# Patient Record
Sex: Male | Born: 1955 | Race: White | Hispanic: No | Marital: Married | Smoking: Never smoker
Health system: Southern US, Community
[De-identification: ages and names within clinical notes are randomized; demographics above are authoritative.]

## PROBLEM LIST (undated history)

## (undated) DIAGNOSIS — E215 Disorder of parathyroid gland, unspecified: Secondary | ICD-10-CM

## (undated) DIAGNOSIS — I2510 Atherosclerotic heart disease of native coronary artery without angina pectoris: Secondary | ICD-10-CM

## (undated) DIAGNOSIS — E785 Hyperlipidemia, unspecified: Secondary | ICD-10-CM

## (undated) DIAGNOSIS — E039 Hypothyroidism, unspecified: Secondary | ICD-10-CM

## (undated) DIAGNOSIS — Z85850 Personal history of malignant neoplasm of thyroid: Secondary | ICD-10-CM

## (undated) HISTORY — DX: Atherosclerotic heart disease of native coronary artery without angina pectoris: I25.10

## (undated) HISTORY — DX: Disorder of parathyroid gland, unspecified: E21.5

## (undated) HISTORY — PX: THYROIDECTOMY: SHX17

## (undated) HISTORY — DX: Hypothyroidism, unspecified: E03.9

## (undated) HISTORY — DX: Personal history of malignant neoplasm of thyroid: Z85.850

## (undated) HISTORY — DX: Hyperlipidemia, unspecified: E78.5

---

## 2020-06-03 DIAGNOSIS — E215 Disorder of parathyroid gland, unspecified: Secondary | ICD-10-CM | POA: Diagnosis not present

## 2020-06-03 DIAGNOSIS — Z125 Encounter for screening for malignant neoplasm of prostate: Secondary | ICD-10-CM | POA: Diagnosis not present

## 2020-06-03 DIAGNOSIS — Z85850 Personal history of malignant neoplasm of thyroid: Secondary | ICD-10-CM | POA: Diagnosis not present

## 2020-06-03 DIAGNOSIS — E785 Hyperlipidemia, unspecified: Secondary | ICD-10-CM | POA: Diagnosis not present

## 2020-06-03 DIAGNOSIS — E038 Other specified hypothyroidism: Secondary | ICD-10-CM | POA: Diagnosis not present

## 2020-06-30 DIAGNOSIS — R079 Chest pain, unspecified: Secondary | ICD-10-CM | POA: Diagnosis not present

## 2020-07-25 DIAGNOSIS — R7309 Other abnormal glucose: Secondary | ICD-10-CM | POA: Diagnosis not present

## 2020-07-25 DIAGNOSIS — E038 Other specified hypothyroidism: Secondary | ICD-10-CM | POA: Diagnosis not present

## 2020-07-25 DIAGNOSIS — R221 Localized swelling, mass and lump, neck: Secondary | ICD-10-CM | POA: Diagnosis not present

## 2020-07-25 DIAGNOSIS — E785 Hyperlipidemia, unspecified: Secondary | ICD-10-CM | POA: Diagnosis not present

## 2020-07-25 DIAGNOSIS — Z85850 Personal history of malignant neoplasm of thyroid: Secondary | ICD-10-CM | POA: Diagnosis not present

## 2020-08-04 DIAGNOSIS — H353111 Nonexudative age-related macular degeneration, right eye, early dry stage: Secondary | ICD-10-CM | POA: Diagnosis not present

## 2020-08-04 DIAGNOSIS — H52223 Regular astigmatism, bilateral: Secondary | ICD-10-CM | POA: Diagnosis not present

## 2020-08-04 DIAGNOSIS — H25043 Posterior subcapsular polar age-related cataract, bilateral: Secondary | ICD-10-CM | POA: Diagnosis not present

## 2020-08-18 ENCOUNTER — Other Ambulatory Visit: Payer: Self-pay | Admitting: Family Medicine

## 2020-08-18 DIAGNOSIS — R221 Localized swelling, mass and lump, neck: Secondary | ICD-10-CM

## 2020-08-31 ENCOUNTER — Ambulatory Visit
Admission: RE | Admit: 2020-08-31 | Discharge: 2020-08-31 | Disposition: A | Discharge status: Home / Self Care | Payer: BC Managed Care – PPO | Source: Ambulatory Visit | Attending: Family Medicine | Admitting: Family Medicine

## 2020-08-31 DIAGNOSIS — R221 Localized swelling, mass and lump, neck: Secondary | ICD-10-CM

## 2020-09-02 ENCOUNTER — Ambulatory Visit
Admission: RE | Admit: 2020-09-02 | Discharge: 2020-09-02 | Disposition: A | Discharge status: Home / Self Care | Payer: BC Managed Care – PPO | Source: Ambulatory Visit | Attending: Family Medicine | Admitting: Family Medicine

## 2020-09-02 ENCOUNTER — Other Ambulatory Visit: Payer: Self-pay | Admitting: Family Medicine

## 2020-09-02 ENCOUNTER — Other Ambulatory Visit: Payer: Self-pay

## 2020-09-02 DIAGNOSIS — E890 Postprocedural hypothyroidism: Secondary | ICD-10-CM | POA: Diagnosis not present

## 2020-09-02 DIAGNOSIS — K118 Other diseases of salivary glands: Secondary | ICD-10-CM

## 2020-09-02 DIAGNOSIS — Z85850 Personal history of malignant neoplasm of thyroid: Secondary | ICD-10-CM | POA: Diagnosis not present

## 2020-09-02 DIAGNOSIS — J0110 Acute frontal sinusitis, unspecified: Secondary | ICD-10-CM | POA: Diagnosis not present

## 2020-09-02 MED ORDER — IOPAMIDOL (ISOVUE-300) INJECTION 61%
75.0000 mL | Freq: Once | INTRAVENOUS | Status: AC | PRN
  Administered 2020-09-02: 75 mL via INTRAVENOUS

## 2020-12-22 DIAGNOSIS — E038 Other specified hypothyroidism: Secondary | ICD-10-CM | POA: Diagnosis not present

## 2020-12-22 DIAGNOSIS — E785 Hyperlipidemia, unspecified: Secondary | ICD-10-CM | POA: Diagnosis not present

## 2020-12-22 DIAGNOSIS — Z125 Encounter for screening for malignant neoplasm of prostate: Secondary | ICD-10-CM | POA: Diagnosis not present

## 2020-12-22 DIAGNOSIS — Z0000 Encounter for general adult medical examination without abnormal findings: Secondary | ICD-10-CM | POA: Diagnosis not present

## 2020-12-27 DIAGNOSIS — Z0000 Encounter for general adult medical examination without abnormal findings: Secondary | ICD-10-CM | POA: Diagnosis not present

## 2021-01-10 DIAGNOSIS — J343 Hypertrophy of nasal turbinates: Secondary | ICD-10-CM | POA: Diagnosis not present

## 2021-01-10 DIAGNOSIS — K118 Other diseases of salivary glands: Secondary | ICD-10-CM | POA: Diagnosis not present

## 2021-02-14 DIAGNOSIS — Z08 Encounter for follow-up examination after completed treatment for malignant neoplasm: Secondary | ICD-10-CM | POA: Diagnosis not present

## 2021-02-14 DIAGNOSIS — L814 Other melanin hyperpigmentation: Secondary | ICD-10-CM | POA: Diagnosis not present

## 2021-02-14 DIAGNOSIS — D225 Melanocytic nevi of trunk: Secondary | ICD-10-CM | POA: Diagnosis not present

## 2021-02-14 DIAGNOSIS — L821 Other seborrheic keratosis: Secondary | ICD-10-CM | POA: Diagnosis not present

## 2021-02-14 DIAGNOSIS — D485 Neoplasm of uncertain behavior of skin: Secondary | ICD-10-CM | POA: Diagnosis not present

## 2021-06-30 DIAGNOSIS — E038 Other specified hypothyroidism: Secondary | ICD-10-CM | POA: Diagnosis not present

## 2021-06-30 DIAGNOSIS — R7309 Other abnormal glucose: Secondary | ICD-10-CM | POA: Diagnosis not present

## 2021-06-30 DIAGNOSIS — E785 Hyperlipidemia, unspecified: Secondary | ICD-10-CM | POA: Diagnosis not present

## 2021-06-30 DIAGNOSIS — Z5181 Encounter for therapeutic drug level monitoring: Secondary | ICD-10-CM | POA: Diagnosis not present

## 2021-06-30 DIAGNOSIS — Z85850 Personal history of malignant neoplasm of thyroid: Secondary | ICD-10-CM | POA: Diagnosis not present

## 2021-08-31 DIAGNOSIS — L814 Other melanin hyperpigmentation: Secondary | ICD-10-CM | POA: Diagnosis not present

## 2021-08-31 DIAGNOSIS — D2262 Melanocytic nevi of left upper limb, including shoulder: Secondary | ICD-10-CM | POA: Diagnosis not present

## 2021-08-31 DIAGNOSIS — L821 Other seborrheic keratosis: Secondary | ICD-10-CM | POA: Diagnosis not present

## 2021-08-31 DIAGNOSIS — D225 Melanocytic nevi of trunk: Secondary | ICD-10-CM | POA: Diagnosis not present

## 2021-09-28 DIAGNOSIS — H52223 Regular astigmatism, bilateral: Secondary | ICD-10-CM | POA: Diagnosis not present

## 2021-09-28 DIAGNOSIS — H40033 Anatomical narrow angle, bilateral: Secondary | ICD-10-CM | POA: Diagnosis not present

## 2021-12-28 DIAGNOSIS — Z0000 Encounter for general adult medical examination without abnormal findings: Secondary | ICD-10-CM | POA: Diagnosis not present

## 2021-12-28 DIAGNOSIS — R7309 Other abnormal glucose: Secondary | ICD-10-CM | POA: Diagnosis not present

## 2021-12-28 DIAGNOSIS — Z85850 Personal history of malignant neoplasm of thyroid: Secondary | ICD-10-CM | POA: Diagnosis not present

## 2021-12-28 DIAGNOSIS — Z125 Encounter for screening for malignant neoplasm of prostate: Secondary | ICD-10-CM | POA: Diagnosis not present

## 2021-12-28 DIAGNOSIS — E785 Hyperlipidemia, unspecified: Secondary | ICD-10-CM | POA: Diagnosis not present

## 2021-12-28 DIAGNOSIS — E215 Disorder of parathyroid gland, unspecified: Secondary | ICD-10-CM | POA: Diagnosis not present

## 2021-12-28 DIAGNOSIS — E038 Other specified hypothyroidism: Secondary | ICD-10-CM | POA: Diagnosis not present

## 2022-01-02 DIAGNOSIS — Z85850 Personal history of malignant neoplasm of thyroid: Secondary | ICD-10-CM | POA: Diagnosis not present

## 2022-01-02 DIAGNOSIS — E785 Hyperlipidemia, unspecified: Secondary | ICD-10-CM | POA: Diagnosis not present

## 2022-01-02 DIAGNOSIS — Z0000 Encounter for general adult medical examination without abnormal findings: Secondary | ICD-10-CM | POA: Diagnosis not present

## 2022-01-02 DIAGNOSIS — E038 Other specified hypothyroidism: Secondary | ICD-10-CM | POA: Diagnosis not present

## 2022-01-14 NOTE — Progress Notes (Unsigned)
Cardiology Office Note   Date:  01/15/2022   ID:  Philipp, Callegari [birth year], MRN [id number]  PCP:  Farris Has, MD    No chief complaint on file.  CAD  Wt Readings from Last 3 Encounters:  01/15/22 233 lb 3.2 oz (105.8 kg)       History of Present Illness: Brian Bonilla is a 66 y.o. male who is being seen today for the evaluation of CAD at the request of Farris Has, MD.   He has a h/o CAD and wanted a local cardiologist.   In around 2018, he had calcium scoring CT in [place].  Score was high.  He was referred to heart doctor. He lost 45 pound with a vegan diet.  His blood work improved. He had a stress test which was normal.   Moved to [place] in December 2021.  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  Business has improved since the pandemic. Designs exhibits for trade-shows.  Stores exhibits in [employer].   Exercising now regularly with  a son who is a Psychologist, educational.  He has recently lost 8 lbs. Does cardi and light weights, stretching, balance work.   Brother with TIA.   Denies : Chest pain. Dizziness. Leg edema. Nitroglycerin use. Orthopnea. Palpitations. Paroxysmal nocturnal dyspnea. Shortness of breath. Syncope.      Past Medical History:  Diagnosis Date   CAD (coronary artery disease)    History of thyroid cancer    Hyperlipidemia    Hypothyroidism    Parathyroid abnormality (HCC)     Past Surgical History:  Procedure Laterality Date   THYROIDECTOMY       Current Outpatient Medications  Medication Sig Dispense Refill   aspirin EC 81 MG tablet Take 81 mg by mouth daily. Swallow whole.     atorvastatin (LIPITOR) 40 MG tablet Take 40 mg by mouth daily.     calcitRIOL (ROCALTROL) 0.5 MCG capsule Take 0.5 mcg by mouth daily.     CALCIUM-MAGNESIUM PO Take by mouth.     Coenzyme Q10 (COQ10 PO) Take by mouth.     levothyroxine (SYNTHROID) 150 MCG tablet Take 150 mcg by mouth every morning.     LYSINE PO Take by mouth.      MAGNESIUM PO Take by mouth. (Patient not taking: Reported on 01/15/2022)     No current facility-administered medications for this visit.    Allergies:   Patient has no known allergies.    Social History:  The patient  reports that he has never smoked. He has never used smokeless tobacco. He reports current alcohol use. He reports that he does not use drugs.   Family History:  The patient's family history includes Aneurysm in his mother; Healthy in his brother, brother, daughter, son, son, son, and son.    ROS:  Please see the history of present illness.   Otherwise, review of systems are positive for intentional weight loss.   All other systems are reviewed and negative.    PHYSICAL EXAM: VS:  BP 110/70   Pulse 78   Ht 6\' 2"  (1.88 m)   Wt 233 lb 3.2 oz (105.8 kg)   SpO2 97%   BMI 29.94 kg/m  , BMI Body mass index is 29.94 kg/m. GEN: Well nourished, well developed, in no acute distress HEENT: normal Neck: no JVD, carotid bruits, or masses Cardiac: RRR; no murmurs, rubs, or gallops,no edema  Respiratory:  clear to auscultation bilaterally, normal  work of breathing GI: soft, nontender, nondistended, + BS MS: no deformity or atrophy Skin: warm and dry, no rash Neuro:  Strength and sensation are intact Psych: euthymic mood, full affect   EKG:   The ekg ordered today demonstrates normal sinus rhythm    Recent Labs: No results found for requested labs within last 365 days.   Lipid Panel No results found for: "CHOL", "TRIG", "HDL", "CHOLHDL", "VLDL", "LDLCALC", "LDLDIRECT"   Other studies Reviewed: Additional studies/ records that were reviewed today with results demonstrating: labs reviewed: Total cholesterol 146 HDL 49 LDL 80 triglycerides 92 in August 2023.   ASSESSMENT AND PLAN:  Coronary calcification: He thinks his calcium score was in the 1200 range several years ago.  Currently, he is doing well.  No angina on medical therapy. Continue healthy preventive  lifestyle.  He is taking a baby aspirin daily.  No bleeding problems.  He follows a healthy diet.  He exercises regularly.   Hyperlipidemia: Tolerating atorvastatin well for many years.   Family h/o aneurysm: Mother with brain aneurysm.  Was on coumadin at the time.    Current medicines are reviewed at length with the patient today.  The patient concerns regarding his medicines were addressed.  The following changes have been made:  No change  Labs/ tests ordered today include:   Orders Placed This Encounter  Procedures   EKG 12-Lead    Recommend 150 minutes/week of aerobic exercise Low fat, low carb, high fiber diet recommended  Disposition:   FU in 1 year   Signed, Larae Grooms, MD  01/15/2022 10:45 AM    [address], [place], [place]  [address] Phone: [phone]; Fax: [phone]

## 2022-01-15 ENCOUNTER — Encounter: Payer: Self-pay | Admitting: Interventional Cardiology

## 2022-01-15 ENCOUNTER — Ambulatory Visit: Payer: BC Managed Care – PPO | Attending: Interventional Cardiology | Admitting: Interventional Cardiology

## 2022-01-15 VITALS — BP 110/70 | HR 78 | Ht 74.0 in | Wt 233.2 lb

## 2022-01-15 DIAGNOSIS — I2510 Atherosclerotic heart disease of native coronary artery without angina pectoris: Secondary | ICD-10-CM

## 2022-01-15 DIAGNOSIS — I2584 Coronary atherosclerosis due to calcified coronary lesion: Secondary | ICD-10-CM | POA: Diagnosis not present

## 2022-01-15 DIAGNOSIS — I25118 Atherosclerotic heart disease of native coronary artery with other forms of angina pectoris: Secondary | ICD-10-CM

## 2022-01-15 DIAGNOSIS — E782 Mixed hyperlipidemia: Secondary | ICD-10-CM

## 2022-01-15 NOTE — Patient Instructions (Signed)
Medication Instructions:  Your physician recommends that you continue on your current medications as directed. Please refer to the Current Medication list given to you today.  *If you need a refill on your cardiac medications before your next appointment, please call your pharmacy*   Lab Work: none If you have labs (blood work) drawn today and your tests are completely normal, you will receive your results only by: MyChart Message (if you have MyChart) OR A paper copy in the mail If you have any lab test that is abnormal or we need to change your treatment, we will call you to review the results.   Testing/Procedures: none   Follow-Up: At [facility], you and your health needs are our priority.  As part of our continuing mission to provide you with exceptional heart care, we have created designated Provider Care Teams.  These Care Teams include your primary Cardiologist (physician) and Advanced Practice Providers (APPs -  Physician Assistants and Nurse Practitioners) who all work together to provide you with the care you need, when you need it.  We recommend signing up for the patient portal called "MyChart".  Sign up information is provided on this After Visit Summary.  MyChart is used to connect with patients for Virtual Visits (Telemedicine).  Patients are able to view lab/test results, encounter notes, upcoming appointments, etc.  Non-urgent messages can be sent to your provider as well.   To learn more about what you can do with MyChart, go to [email or web address].    Your next appointment:   12 month(s)  The format for your next appointment:   In Person  Provider:   Jayadeep Varanasi, MD     Other Instructions    Important Information About Sugar       

## 2022-04-04 DIAGNOSIS — U071 COVID-19: Secondary | ICD-10-CM | POA: Diagnosis not present

## 2022-05-11 DIAGNOSIS — H9191 Unspecified hearing loss, right ear: Secondary | ICD-10-CM | POA: Diagnosis not present

## 2022-05-11 DIAGNOSIS — H6593 Unspecified nonsuppurative otitis media, bilateral: Secondary | ICD-10-CM | POA: Diagnosis not present

## 2022-06-18 DIAGNOSIS — H93293 Other abnormal auditory perceptions, bilateral: Secondary | ICD-10-CM | POA: Diagnosis not present

## 2022-06-28 DIAGNOSIS — R7309 Other abnormal glucose: Secondary | ICD-10-CM | POA: Diagnosis not present

## 2022-06-28 DIAGNOSIS — I2510 Atherosclerotic heart disease of native coronary artery without angina pectoris: Secondary | ICD-10-CM | POA: Diagnosis not present

## 2022-06-28 DIAGNOSIS — E785 Hyperlipidemia, unspecified: Secondary | ICD-10-CM | POA: Diagnosis not present

## 2022-07-03 DIAGNOSIS — E785 Hyperlipidemia, unspecified: Secondary | ICD-10-CM | POA: Diagnosis not present

## 2022-07-03 DIAGNOSIS — M791 Myalgia, unspecified site: Secondary | ICD-10-CM | POA: Diagnosis not present

## 2022-07-03 DIAGNOSIS — E038 Other specified hypothyroidism: Secondary | ICD-10-CM | POA: Diagnosis not present

## 2022-07-03 DIAGNOSIS — H938X1 Other specified disorders of right ear: Secondary | ICD-10-CM | POA: Diagnosis not present

## 2022-08-21 DIAGNOSIS — L821 Other seborrheic keratosis: Secondary | ICD-10-CM | POA: Diagnosis not present

## 2022-08-21 DIAGNOSIS — L570 Actinic keratosis: Secondary | ICD-10-CM | POA: Diagnosis not present

## 2022-08-21 DIAGNOSIS — L814 Other melanin hyperpigmentation: Secondary | ICD-10-CM | POA: Diagnosis not present

## 2022-08-21 DIAGNOSIS — D225 Melanocytic nevi of trunk: Secondary | ICD-10-CM | POA: Diagnosis not present

## 2022-10-06 IMAGING — CT CT NECK W/ CM
2 of 4 series · 5 of 14 positions shown, 6 images · IV contrast (iopamidol)
Comparison: Ultrasound soft tissue neck 08/31/2020

CLINICAL DATA: Left submandibular gland mass on ultrasound. History
of thyroid cancer.

EXAM:
CT NECK WITH CONTRAST
TECHNIQUE: Multidetector CT imaging of the neck was performed using the
standard protocol following the bolus administration of intravenous
contrast.
CONTRAST:  75mL Z22SSC-HEE IOPAMIDOL (Z22SSC-HEE) INJECTION 61%

[Series 3: neck · axial · 0.53mm/px · z∈[-238,-150]mm · 2 of 132 slices shown]
[im 44/132  bone]
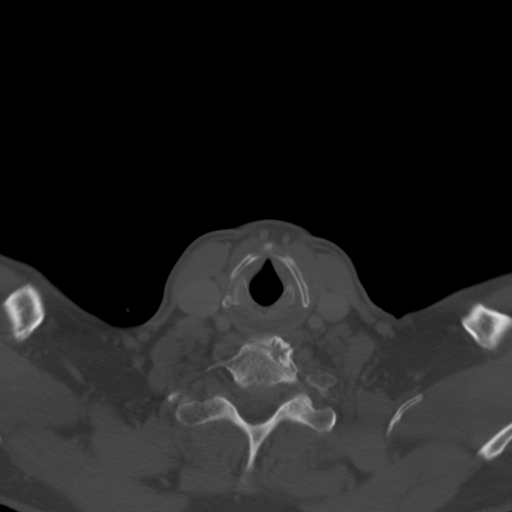
[im 88/132  bone]
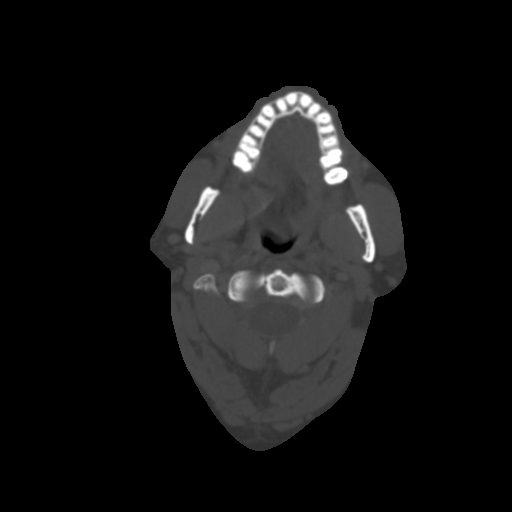

[Series 8: angled axial-oropharynx · axial · 0.39mm/px · z∈[-284,-142]mm · 3 of 147 slices shown, 4 images]
[im 37/147  soft-tissue]
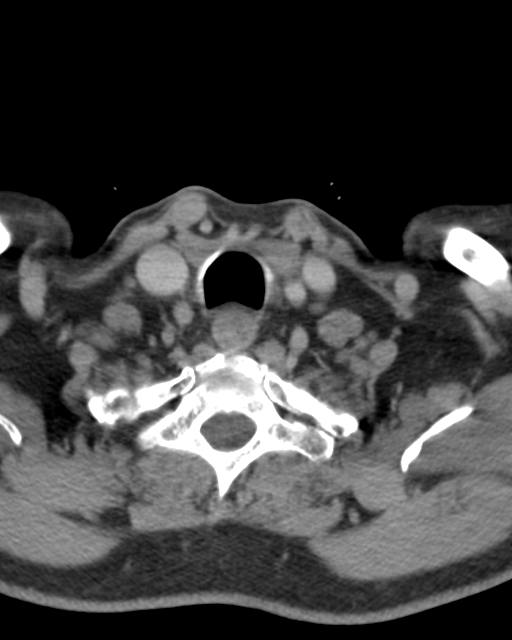
[im 37/147  bone]
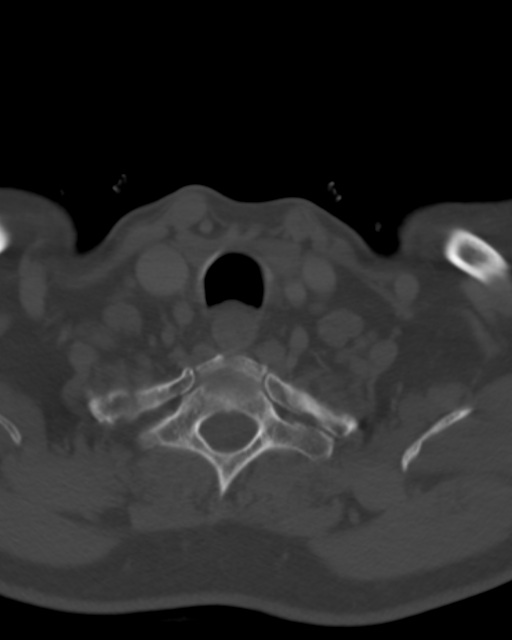
[im 74/147  bone]
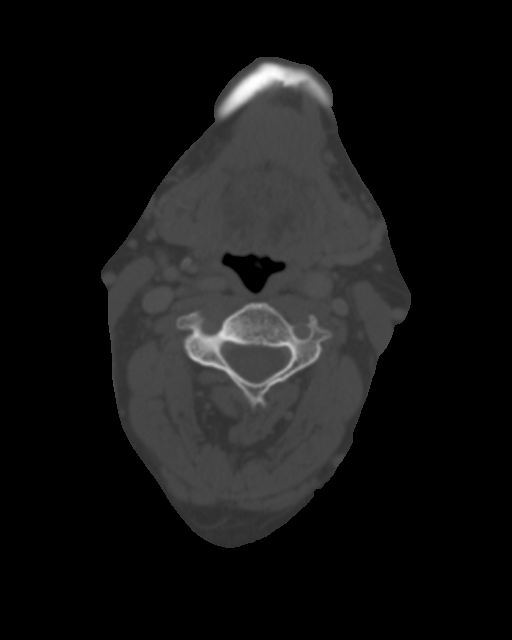
[im 110/147  bone]
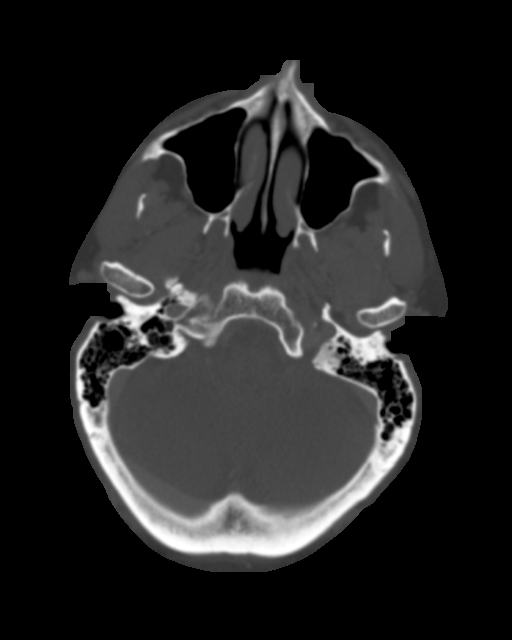

[5 of 14 positions shown; findings below may reference images not displayed]

FINDINGS: Pharynx and larynx: Normal. No mass or swelling.

Salivary glands: No inflammation, mass, or stone. Fatty infiltration
of the parotid gland bilaterally.

Left submandibular gland normal without edema or stone. Ultrasound
findings may be due to fatty tissue adjacent to the submandibular
gland.

Thyroid: Postop thyroidectomy.  No recurrent mass.

Lymph nodes: Negative for enlarged lymph node in the neck.

Vascular: Normal vascular enhancement.

Limited intracranial: Negative

Visualized orbits: Negative

Mastoids and visualized paranasal sinuses: Mild mucosal edema in the
frontal and ethmoid sinuses. Remaining sinuses clear.

Skeleton: Cervical degenerative changes. No acute skeletal
abnormality.

Upper chest: Lung apices clear bilaterally.

Other: None
IMPRESSION: Negative for mass or adenopathy in the neck

Left submandibular gland appears normal.

## 2022-12-10 DIAGNOSIS — K080 Exfoliation of teeth due to systemic causes: Secondary | ICD-10-CM | POA: Diagnosis not present

## 2023-01-02 DIAGNOSIS — K080 Exfoliation of teeth due to systemic causes: Secondary | ICD-10-CM | POA: Diagnosis not present

## 2023-01-03 DIAGNOSIS — Z125 Encounter for screening for malignant neoplasm of prostate: Secondary | ICD-10-CM | POA: Diagnosis not present

## 2023-01-03 DIAGNOSIS — R7309 Other abnormal glucose: Secondary | ICD-10-CM | POA: Diagnosis not present

## 2023-01-03 DIAGNOSIS — E215 Disorder of parathyroid gland, unspecified: Secondary | ICD-10-CM | POA: Diagnosis not present

## 2023-01-03 DIAGNOSIS — E038 Other specified hypothyroidism: Secondary | ICD-10-CM | POA: Diagnosis not present

## 2023-01-03 DIAGNOSIS — E785 Hyperlipidemia, unspecified: Secondary | ICD-10-CM | POA: Diagnosis not present

## 2023-01-03 DIAGNOSIS — I2510 Atherosclerotic heart disease of native coronary artery without angina pectoris: Secondary | ICD-10-CM | POA: Diagnosis not present

## 2023-01-03 DIAGNOSIS — Z0000 Encounter for general adult medical examination without abnormal findings: Secondary | ICD-10-CM | POA: Diagnosis not present

## 2023-01-08 DIAGNOSIS — I2510 Atherosclerotic heart disease of native coronary artery without angina pectoris: Secondary | ICD-10-CM | POA: Diagnosis not present

## 2023-01-08 DIAGNOSIS — E038 Other specified hypothyroidism: Secondary | ICD-10-CM | POA: Diagnosis not present

## 2023-01-08 DIAGNOSIS — E785 Hyperlipidemia, unspecified: Secondary | ICD-10-CM | POA: Diagnosis not present

## 2023-01-08 DIAGNOSIS — Z0000 Encounter for general adult medical examination without abnormal findings: Secondary | ICD-10-CM | POA: Diagnosis not present

## 2023-03-25 ENCOUNTER — Encounter: Payer: Self-pay | Admitting: Cardiovascular Disease

## 2023-03-25 NOTE — Progress Notes (Unsigned)
Cardiology Office Note:  .   Date:  03/26/2023  ID:  EUSTACIO LUTH, DOB Jun 09, 1955, MRN [id number] PCP: Farris Has, MD  [facility] Providers Cardiologist:  Lance Muss, MD    History of Present Illness: .    Nov. 26, 2024  LOWELL ORDOYNE is a 67 y.o. male with hx of coronary artery calicifcaion  He was previously seen by Dr. Eldridge Dace. I am meeting him for the first time today  Hx of HLD  Lived in [place]  6 years ago, his primary MD sent him for a stress test and coronary calcium score .    He drastically changed his diet ( became a vegan for 14 months )  Lost 45 lbs   He has moved to [place] .  Has regained 35 lbs.   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 2024)  Wt is 244 lbs   Will add zetia 10 mg  He will check labs with dr. Kateri Plummer on March 8          ROS:    Studies Reviewed: Marland Kitchen   EKG Interpretation Date/Time:  Tuesday March 26 2023 15:05:56 EST Ventricular Rate:  70 PR Interval:  138 QRS Duration:  84 QT Interval:  408 QTC Calculation: 440 R Axis:   72  Text Interpretation: Normal sinus rhythm ST & T wave abnormality, consider inferior ischemia No significant change since last tracing Confirmed by Kristeen Miss ([id number]) on 03/26/2023 3:19:28 PM     Risk Assessment/Calculations:             Physical Exam:   VS:  BP 108/72   Pulse 70   Ht 6\' 2"  (1.88 m)   Wt 244 lb 12.8 oz (111 kg)   SpO2 95%   BMI 31.43 kg/m    Wt Readings from Last 3 Encounters:  03/26/23 244 lb 12.8 oz (111 kg)  01/15/22 233 lb 3.2 oz (105.8 kg)    GEN: Well nourished, well developed in no acute distress NECK: No JVD; No carotid bruits CARDIAC: RRR, no murmurs, rubs, gallops RESPIRATORY:  Clear to auscultation without rales, wheezing or rhonchi  ABDOMEN: Soft, non-tender,  non-distended EXTREMITIES:  No edema; No deformity   ASSESSMENT AND PLAN: .   1.  Coronary artery calcifications: Patient has a history of coronary artery calcifications.  He had a stress test while up in [place] which was negative.  He is never had any episodes of angina.  He has moved to [place] and since that time he is gone off of his vegan diet and gained quite a bit of weight.  He is committed to restarting a better diet.  He is interested in weight loss.  He has been on atorvastatin.  His last LDL is 96.  Will add Zetia 10 mg a day to his medication list.  He will work on diet, exercise, weight loss.  We have given him some information on the Mediterranean diet.  He will get follow-up labs with his primary medical doctor in about 3 months.  I will see him in mid March for follow-up visit.  His LDL goal is less than 70.  He will call us if he develops any episodes of chest pain or  chest pressure with exertion.       Dispo:   3 months with me    Signed, Kristeen Miss, MD

## 2023-03-26 ENCOUNTER — Ambulatory Visit: Payer: Medicare Other | Attending: Cardiovascular Disease | Admitting: Cardiovascular Disease

## 2023-03-26 ENCOUNTER — Encounter: Payer: Self-pay | Admitting: Cardiovascular Disease

## 2023-03-26 VITALS — BP 108/72 | HR 70 | Ht 74.0 in | Wt 244.8 lb

## 2023-03-26 DIAGNOSIS — I2510 Atherosclerotic heart disease of native coronary artery without angina pectoris: Secondary | ICD-10-CM

## 2023-03-26 MED ORDER — EZETIMIBE 10 MG PO TABS: 10.0000 mg | ORAL_TABLET | Freq: Every day | ORAL | 3 refills | Status: AC

## 2023-03-26 NOTE — Patient Instructions (Signed)
Medication Instructions:  START Zetia/Ezetimibe 10mg  daily *If you need a refill on your cardiac medications before your next appointment, please call your pharmacy*  Follow-Up: At [facility], you and your health needs are our priority.  As part of our continuing mission to provide you with exceptional heart care, we have created designated Provider Care Teams.  These Care Teams include your primary Cardiologist (physician) and Advanced Practice Providers (APPs -  Physician Assistants and Nurse Practitioners) who all work together to provide you with the care you need, when you need it.  We recommend signing up for the patient portal called "MyChart".  Sign up information is provided on this After Visit Summary.  MyChart is used to connect with patients for Virtual Visits (Telemedicine).  Patients are able to view lab/test results, encounter notes, upcoming appointments, etc.  Non-urgent messages can be sent to your provider as well.   To learn more about what you can do with MyChart, go to [email or web address].    Your next appointment:   4 month(s)  Provider:   Kristeen Miss, MD

## 2023-07-05 DIAGNOSIS — E038 Other specified hypothyroidism: Secondary | ICD-10-CM | POA: Diagnosis not present

## 2023-07-05 DIAGNOSIS — I2510 Atherosclerotic heart disease of native coronary artery without angina pectoris: Secondary | ICD-10-CM | POA: Diagnosis not present

## 2023-07-05 DIAGNOSIS — E785 Hyperlipidemia, unspecified: Secondary | ICD-10-CM | POA: Diagnosis not present

## 2023-07-05 DIAGNOSIS — R7309 Other abnormal glucose: Secondary | ICD-10-CM | POA: Diagnosis not present

## 2023-07-05 LAB — COMPREHENSIVE METABOLIC PANEL WITH GFR: EGFR (Non-African Amer.): 74

## 2023-07-05 LAB — TSH: TSH: 0.28 — AB (ref 0.41–5.90)

## 2023-07-09 DIAGNOSIS — I2510 Atherosclerotic heart disease of native coronary artery without angina pectoris: Secondary | ICD-10-CM | POA: Diagnosis not present

## 2023-07-09 DIAGNOSIS — E785 Hyperlipidemia, unspecified: Secondary | ICD-10-CM | POA: Diagnosis not present

## 2023-07-09 DIAGNOSIS — E038 Other specified hypothyroidism: Secondary | ICD-10-CM | POA: Diagnosis not present

## 2023-07-16 ENCOUNTER — Ambulatory Visit: Payer: Medicare Other | Admitting: Cardiovascular Disease

## 2023-07-22 ENCOUNTER — Encounter: Payer: Self-pay | Admitting: Cardiovascular Disease

## 2023-07-22 NOTE — Progress Notes (Unsigned)
  Cardiology Office Note:  .   Date:  07/23/2023  ID:  Brian, Bonilla Sep 03, 1955, MRN [id number] PCP: Farris Has, MD  [facility] Providers Cardiologist:  Lance Muss, MD    History of Present Illness: .    Nov. 26, 2024  Brian Bonilla is a 68 y.o. male with hx of coronary artery calicifcaion  He was previously seen by Dr. Eldridge Dace. I am meeting him for the first time today  Hx of HLD  Lived in [place]  6 years ago, his primary MD sent him for a stress test and coronary calcium score .    He drastically changed his diet ( became a vegan for 14 months )  Lost 45 lbs   He has moved to [place] .  Has regained 35 lbs.   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 2024)  Wt is 244 lbs   Will add zetia 10 mg  He will check labs with dr. 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 2020 was 1504 ( > 90th percentile for age / sex matched controls )   We discussed increasing the atorva to 70 He wants to stay at 40 mg atorvastatin    ROS:    Studies Reviewed: .         Risk Assessment/Calculations:       Physical Exam:     Physical Exam: Blood pressure 116/72, pulse 74, height 6\' 2"  (1.88 m), weight 244 lb (110.7 kg), SpO2 96%.       GEN:  Well nourished, well developed in no acute distress HEENT: Normal NECK: No JVD; No carotid bruits LYMPHATICS: No  lymphadenopathy CARDIAC: RRR , no murmurs, rubs, gallops RESPIRATORY:  Clear to auscultation without rales, wheezing or rhonchi  ABDOMEN: Soft, non-tender, non-distended MUSCULOSKELETAL:  No edema; No deformity  SKIN: Warm and dry NEUROLOGIC:  Alert and oriented x 3    ASSESSMENT AND PLAN: .   1.  Coronary artery calcifications: He is not having any episodes of angina.  We discussed that his LDL goal is less than 70.  He is currently on atorvastatin 40 mg a day and Zetia 10 mg a day.  He had a much lower cholesterol when he was eating a more vegan like diet.  He will work on changing his diet, weight loss, exercise.   I encouraged him to start working out on a regular basis.          Dispo:       Signed, Kristeen Miss, MD

## 2023-07-23 ENCOUNTER — Ambulatory Visit: Payer: Medicare Other | Attending: Cardiovascular Disease | Admitting: Cardiovascular Disease

## 2023-07-23 ENCOUNTER — Encounter: Payer: Self-pay | Admitting: Cardiovascular Disease

## 2023-07-23 VITALS — BP 116/72 | HR 74 | Ht 74.0 in | Wt 244.0 lb

## 2023-07-23 DIAGNOSIS — I2510 Atherosclerotic heart disease of native coronary artery without angina pectoris: Secondary | ICD-10-CM

## 2023-07-23 DIAGNOSIS — E782 Mixed hyperlipidemia: Secondary | ICD-10-CM | POA: Diagnosis not present

## 2023-07-23 NOTE — Patient Instructions (Signed)
 Lab Work: Lipids, ALT, BMET in 6 months If you have labs (blood work) drawn today and your tests are completely normal, you will receive your results only by: MyChart Message (if you have MyChart) OR A paper copy in the mail If you have any lab test that is abnormal or we need to change your treatment, we will call you to review the results.  Follow-Up: At [facility], you and your health needs are our priority.  As part of our continuing mission to provide you with exceptional heart care, we have created designated Provider Care Teams.  These Care Teams include your primary Cardiologist (physician) and Advanced Practice Providers (APPs -  Physician Assistants and Nurse Practitioners) who all work together to provide you with the care you need, when you need it.  Your next appointment:   1 year(s)  Provider:   Kristeen Miss, MD or APP  Other Instructions   1st Floor: - Lobby - Registration  - Pharmacy  - Lab - Cafe  2nd Floor: - PV Lab - Diagnostic Testing (echo, CT, nuclear med)  3rd Floor: - Vacant  4th Floor: - TCTS (cardiothoracic surgery) - AFib Clinic - Structural Heart Clinic - Vascular Surgery  - Vascular Ultrasound  5th Floor: - HeartCare Cardiology (general and EP) - Clinical Pharmacy for coumadin, hypertension, lipid, weight-loss medications, and med management appointments    Valet parking services will be available as well.

## 2023-07-31 DIAGNOSIS — R58 Hemorrhage, not elsewhere classified: Secondary | ICD-10-CM | POA: Diagnosis not present

## 2023-08-21 DIAGNOSIS — L821 Other seborrheic keratosis: Secondary | ICD-10-CM | POA: Diagnosis not present

## 2023-08-21 DIAGNOSIS — D225 Melanocytic nevi of trunk: Secondary | ICD-10-CM | POA: Diagnosis not present

## 2023-08-21 DIAGNOSIS — L814 Other melanin hyperpigmentation: Secondary | ICD-10-CM | POA: Diagnosis not present

## 2023-08-21 DIAGNOSIS — B001 Herpesviral vesicular dermatitis: Secondary | ICD-10-CM | POA: Diagnosis not present

## 2023-09-16 DIAGNOSIS — E038 Other specified hypothyroidism: Secondary | ICD-10-CM | POA: Diagnosis not present

## 2023-10-16 DIAGNOSIS — I2510 Atherosclerotic heart disease of native coronary artery without angina pectoris: Secondary | ICD-10-CM | POA: Diagnosis not present

## 2023-10-29 DIAGNOSIS — H40013 Open angle with borderline findings, low risk, bilateral: Secondary | ICD-10-CM | POA: Diagnosis not present

## 2023-11-14 DIAGNOSIS — I2510 Atherosclerotic heart disease of native coronary artery without angina pectoris: Secondary | ICD-10-CM | POA: Diagnosis not present

## 2023-11-22 DIAGNOSIS — E039 Hypothyroidism, unspecified: Secondary | ICD-10-CM | POA: Diagnosis not present

## 2023-11-28 DIAGNOSIS — I2510 Atherosclerotic heart disease of native coronary artery without angina pectoris: Secondary | ICD-10-CM | POA: Diagnosis not present

## 2023-11-28 DIAGNOSIS — H409 Unspecified glaucoma: Secondary | ICD-10-CM | POA: Diagnosis not present

## 2023-11-28 DIAGNOSIS — E785 Hyperlipidemia, unspecified: Secondary | ICD-10-CM | POA: Diagnosis not present

## 2023-11-28 DIAGNOSIS — E038 Other specified hypothyroidism: Secondary | ICD-10-CM | POA: Diagnosis not present

## 2023-12-16 DIAGNOSIS — I2510 Atherosclerotic heart disease of native coronary artery without angina pectoris: Secondary | ICD-10-CM | POA: Diagnosis not present

## 2023-12-18 DIAGNOSIS — K080 Exfoliation of teeth due to systemic causes: Secondary | ICD-10-CM | POA: Diagnosis not present

## 2023-12-29 DIAGNOSIS — E785 Hyperlipidemia, unspecified: Secondary | ICD-10-CM | POA: Diagnosis not present

## 2023-12-29 DIAGNOSIS — H409 Unspecified glaucoma: Secondary | ICD-10-CM | POA: Diagnosis not present

## 2023-12-29 DIAGNOSIS — I2510 Atherosclerotic heart disease of native coronary artery without angina pectoris: Secondary | ICD-10-CM | POA: Diagnosis not present

## 2023-12-29 DIAGNOSIS — E038 Other specified hypothyroidism: Secondary | ICD-10-CM | POA: Diagnosis not present

## 2023-12-31 DIAGNOSIS — K080 Exfoliation of teeth due to systemic causes: Secondary | ICD-10-CM | POA: Diagnosis not present

## 2024-01-28 DIAGNOSIS — E038 Other specified hypothyroidism: Secondary | ICD-10-CM | POA: Diagnosis not present

## 2024-01-28 DIAGNOSIS — I2510 Atherosclerotic heart disease of native coronary artery without angina pectoris: Secondary | ICD-10-CM | POA: Diagnosis not present

## 2024-01-28 DIAGNOSIS — E785 Hyperlipidemia, unspecified: Secondary | ICD-10-CM | POA: Diagnosis not present

## 2024-01-28 DIAGNOSIS — H409 Unspecified glaucoma: Secondary | ICD-10-CM | POA: Diagnosis not present

## 2024-02-03 DIAGNOSIS — I2510 Atherosclerotic heart disease of native coronary artery without angina pectoris: Secondary | ICD-10-CM | POA: Diagnosis not present

## 2024-02-03 DIAGNOSIS — E215 Disorder of parathyroid gland, unspecified: Secondary | ICD-10-CM | POA: Diagnosis not present

## 2024-02-03 DIAGNOSIS — R7309 Other abnormal glucose: Secondary | ICD-10-CM | POA: Diagnosis not present

## 2024-02-03 DIAGNOSIS — E785 Hyperlipidemia, unspecified: Secondary | ICD-10-CM | POA: Diagnosis not present

## 2024-02-03 DIAGNOSIS — E038 Other specified hypothyroidism: Secondary | ICD-10-CM | POA: Diagnosis not present

## 2024-02-03 DIAGNOSIS — Z125 Encounter for screening for malignant neoplasm of prostate: Secondary | ICD-10-CM | POA: Diagnosis not present

## 2024-02-05 DIAGNOSIS — I2510 Atherosclerotic heart disease of native coronary artery without angina pectoris: Secondary | ICD-10-CM | POA: Diagnosis not present

## 2024-02-05 DIAGNOSIS — E785 Hyperlipidemia, unspecified: Secondary | ICD-10-CM | POA: Diagnosis not present

## 2024-02-05 DIAGNOSIS — E038 Other specified hypothyroidism: Secondary | ICD-10-CM | POA: Diagnosis not present

## 2024-02-05 DIAGNOSIS — R7303 Prediabetes: Secondary | ICD-10-CM | POA: Diagnosis not present

## 2024-02-05 DIAGNOSIS — Z0000 Encounter for general adult medical examination without abnormal findings: Secondary | ICD-10-CM | POA: Diagnosis not present

## 2024-03-01 DIAGNOSIS — I2510 Atherosclerotic heart disease of native coronary artery without angina pectoris: Secondary | ICD-10-CM | POA: Diagnosis not present

## 2024-03-29 DIAGNOSIS — E785 Hyperlipidemia, unspecified: Secondary | ICD-10-CM | POA: Diagnosis not present

## 2024-03-29 DIAGNOSIS — E038 Other specified hypothyroidism: Secondary | ICD-10-CM | POA: Diagnosis not present

## 2024-03-29 DIAGNOSIS — H409 Unspecified glaucoma: Secondary | ICD-10-CM | POA: Diagnosis not present

## 2024-03-29 DIAGNOSIS — I2510 Atherosclerotic heart disease of native coronary artery without angina pectoris: Secondary | ICD-10-CM | POA: Diagnosis not present
